# Patient Record
Sex: Female | Born: 1980 | Race: Black or African American | Hispanic: No | Marital: Single | Smoking: Never smoker
Health system: Southern US, Community
[De-identification: ages and names within clinical notes are randomized; demographics above are authoritative.]

## PROBLEM LIST (undated history)

## (undated) DIAGNOSIS — D649 Anemia, unspecified: Secondary | ICD-10-CM

## (undated) DIAGNOSIS — R011 Cardiac murmur, unspecified: Secondary | ICD-10-CM

---

## 2005-02-04 ENCOUNTER — Emergency Department: Payer: Self-pay | Admitting: Emergency Medicine

## 2005-06-12 ENCOUNTER — Emergency Department: Payer: Self-pay | Admitting: Emergency Medicine

## 2005-07-18 ENCOUNTER — Emergency Department: Payer: Self-pay | Admitting: Emergency Medicine

## 2005-07-18 ENCOUNTER — Other Ambulatory Visit: Payer: Self-pay

## 2005-07-22 ENCOUNTER — Observation Stay: Payer: Self-pay | Admitting: Obstetrics & Gynecology

## 2005-08-03 ENCOUNTER — Inpatient Hospital Stay: Payer: Self-pay

## 2005-08-07 ENCOUNTER — Emergency Department: Payer: Self-pay | Admitting: General Practice

## 2006-06-19 ENCOUNTER — Emergency Department: Payer: Self-pay | Admitting: Emergency Medicine

## 2006-09-23 ENCOUNTER — Emergency Department: Payer: Self-pay | Admitting: Emergency Medicine

## 2007-08-03 ENCOUNTER — Emergency Department: Payer: Self-pay | Admitting: Emergency Medicine

## 2008-02-19 ENCOUNTER — Emergency Department: Payer: Self-pay | Admitting: Emergency Medicine

## 2008-12-12 ENCOUNTER — Emergency Department: Payer: Self-pay | Admitting: Emergency Medicine

## 2011-05-04 ENCOUNTER — Emergency Department: Payer: Self-pay | Admitting: *Deleted

## 2011-05-19 ENCOUNTER — Emergency Department: Payer: Self-pay | Admitting: *Deleted

## 2012-03-15 ENCOUNTER — Emergency Department: Payer: Self-pay | Admitting: Emergency Medicine

## 2012-03-15 LAB — URINALYSIS, COMPLETE
Bilirubin,UR: NEGATIVE
Ketone: NEGATIVE
Nitrite: POSITIVE
Protein: 30
Renal Epithelial: 1
Specific Gravity: 1.024 (ref 1.003–1.030)
Squamous Epithelial: 16
Transitional Epi: <1
WBC UR: 106 /HPF (ref 0–5)
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
## 2013-07-02 ENCOUNTER — Emergency Department: Payer: Self-pay | Admitting: Emergency Medicine

## 2013-07-02 LAB — COMPREHENSIVE METABOLIC PANEL
Albumin: 4.1 g/dL (ref 3.4–5.0)
Alkaline Phosphatase: 72 U/L (ref 50–136)
Bilirubin,Total: 0.5 mg/dL (ref 0.2–1.0)
Calcium, Total: 9 mg/dL (ref 8.5–10.1)
Chloride: 107 mmol/L (ref 98–107)
Co2: 24 mmol/L (ref 21–32)
Creatinine: 0.73 mg/dL (ref 0.60–1.30)
EGFR (African American): >60
EGFR (Non-African Amer.): >60
Glucose: 102 mg/dL — ABNORMAL HIGH (ref 65–99)
Osmolality: 272 (ref 275–301)
SGOT(AST): 22 U/L (ref 15–37)
SGPT (ALT): 18 U/L (ref 12–78)
Total Protein: 8.4 g/dL — ABNORMAL HIGH (ref 6.4–8.2)

## 2013-07-02 LAB — URINALYSIS, COMPLETE
Bacteria: NONE SEEN
Bilirubin,UR: NEGATIVE
Blood: NEGATIVE
Glucose,UR: NEGATIVE mg/dL (ref 0–75)
Ketone: NEGATIVE
Leukocyte Esterase: NEGATIVE
Nitrite: NEGATIVE
Protein: NEGATIVE
RBC,UR: <1 /HPF (ref 0–5)
Specific Gravity: 1.018 (ref 1.003–1.030)
WBC UR: <1 /HPF (ref 0–5)

## 2013-07-02 LAB — CBC
HCT: 36.3 % (ref 35.0–47.0)
MCH: 28.3 pg (ref 26.0–34.0)
MCHC: 33.6 g/dL (ref 32.0–36.0)
MCV: 84 fL (ref 80–100)
RBC: 4.31 10*6/uL (ref 3.80–5.20)
RDW: 13.8 % (ref 11.5–14.5)

## 2013-09-04 ENCOUNTER — Emergency Department: Payer: Self-pay | Admitting: Emergency Medicine

## 2013-09-07 LAB — BETA STREP CULTURE(ARMC)

## 2015-03-19 ENCOUNTER — Encounter (HOSPITAL_COMMUNITY): Payer: Self-pay

## 2015-03-19 ENCOUNTER — Emergency Department (HOSPITAL_COMMUNITY)
Admission: EM | Admit: 2015-03-19 | Discharge: 2015-03-19 | Disposition: A | Discharge status: Home / Self Care | Payer: Self-pay | Attending: Emergency Medicine | Admitting: Emergency Medicine

## 2015-03-19 DIAGNOSIS — T490X5A Adverse effect of local antifungal, anti-infective and anti-inflammatory drugs, initial encounter: Secondary | ICD-10-CM | POA: Insufficient documentation

## 2015-03-19 DIAGNOSIS — Z872 Personal history of diseases of the skin and subcutaneous tissue: Secondary | ICD-10-CM | POA: Insufficient documentation

## 2015-03-19 DIAGNOSIS — T7840XA Allergy, unspecified, initial encounter: Secondary | ICD-10-CM | POA: Insufficient documentation

## 2015-03-19 MED ORDER — PREDNISONE 10 MG PO TABS: ORAL_TABLET | ORAL | Status: DC

## 2015-03-19 NOTE — ED Notes (Signed)
Pt reports started prescription Regin A gel 0.025% (tretinoin) 03-14-15 for first dose on face.  At bedtime she cleans face with proactiv sin purifying mask, applied Retin A, then applied proactiv eye brightening serum.  Onset morning of 03-15-15 woke up and bilateral eyes burning, itching, watering.  Has had those symptoms since and woke 03-18-15 to bilateral eye swellling and redness around eyes.  Blurred vision noted.

## 2015-03-19 NOTE — ED Provider Notes (Signed)
CSN: [id number]     Arrival date & time 03/19/15  1326 History  This chart was scribed for non-physician practitioner, Glendell Docker, NP, working with Daleen Bo, MD, by Stephania Fragmin, ED Scribe. This patient was seen in room TR04C/TR04C and the patient's care was started at 2:12 PM.      Chief Complaint  Patient presents with  . Facial Swelling   The history is provided by the patient. No language interpreter was used.    HPI Comments: Rebecca Dudley is a 34 y.o. female who presents to the Emergency Department complaining of gradual onset, constant, worsening bilateral eye burning, itching, and watering that began 4 days ago after she woke up; the day before, she had just applied her first dose of prescription tretinoin 0.025% gel on her face. Patient then developed bilateral eye swelling and redness yesterday. Patient also uses Proactiv purifying mask and Proactiv brightening serum along with the gel, but she states she started using it before the tretinoin with no adverse effects. She states she has had a similar allergic reaction to a topical treatment in the past, but it was not as severe at this. Patient has not tried any medication for this. She states she tried to avoid Benadryl for the sedating side effects. She denies a history of DM.  History reviewed. No pertinent past medical history. Past Surgical History  Procedure Laterality Date  . Cesarean section     History reviewed. No pertinent family history. History  Substance Use Topics  . Smoking status: Never Smoker   . Smokeless tobacco: Not on file  . Alcohol Use: No   OB History    No data available     Review of Systems  HENT: Positive for facial swelling.   Eyes: Positive for pain, discharge (watery), redness and itching.  All other systems reviewed and are negative.   Allergies  Review of patient's allergies indicates no known allergies.  Home Medications   Prior to Admission medications   Not on File    BP 130/85 mmHg  Pulse 71  Temp(Src) 98.4 F (36.9 C) (Oral)  Resp 18  Ht 4\' 11"  (1.499 m)  Wt 170 lb 14.4 oz (77.52 kg)  BMI 34.50 kg/m2  SpO2 98%  LMP 03/05/2015 Physical Exam  Constitutional: She is oriented to person, place, and time. She appears well-developed and well-nourished. No distress.  HENT:  Head: Normocephalic and atraumatic.  Right Ear: External ear normal.  Left Ear: External ear normal.  Mouth/Throat: Oropharynx is clear and moist.  No oral mucosal swelling  Eyes: Conjunctivae and EOM are normal. Pupils are equal, round, and reactive to light.  No swelling redness noted to conjunctiva  Neck: Neck supple. No tracheal deviation present.  Cardiovascular: Normal rate.   Pulmonary/Chest: Effort normal. No respiratory distress.  Musculoskeletal: Normal range of motion.  Neurological: She is alert and oriented to person, place, and time. She exhibits normal muscle tone. Coordination normal.  Skin:  erythematous dry patchy ares noted to eyes bilaterally. No drainage or warmth noted.  Psychiatric: She has a normal mood and affect. Her behavior is normal.  Nursing note and vitals reviewed.   ED Course  Procedures (including critical care time)  DIAGNOSTIC STUDIES: Oxygen Saturation is 98% on RA, normal by my interpretation.    COORDINATION OF CARE: 2:14 PM - Discussed treatment plan with pt at bedside which includes Rx steroids, Zyrtec and/or Benadryl, and pt agreed to plan.   MDM   Final diagnoses:  Allergic reaction, initial encounter   History and rash consistent with contact dermatitis.no oral swelling noted  I personally performed the services described in this documentation, which was scribed in my presence. The recorded information has been reviewed and is accurate.    Glendell Docker, NP 03/19/15 [place], MD 03/20/15 [phone]

## 2015-03-19 NOTE — Discharge Instructions (Signed)
Take zyrtec and benadryl as discussed. Stop the face creams Allergies  Allergies may happen from anything your body is sensitive to. This may be food, medicines, pollens, chemicals, and many other things. Food allergies can be severe and deadly.  HOME CARE  If you do not know what causes a reaction, keep a diary. Write down the foods you ate and the symptoms that followed. Avoid foods that cause reactions.  If you have red raised spots (hives) or a rash:  Take medicine as told by your doctor.  Use medicines for red raised spots and itching as needed.  Apply cold cloths (compresses) to the skin. Take a cool bath. Avoid hot baths or showers.  If you are severely allergic:  It is often necessary to go to the hospital after you have treated your reaction.  Wear your medical alert jewelry.  You and your family must learn how to give a allergy shot or use an allergy kit (anaphylaxis kit).  Always carry your allergy kit or shot with you. Use this medicine as told by your doctor if a severe reaction is occurring. GET HELP RIGHT AWAY IF:  You have trouble breathing or are making high-pitched whistling sounds (wheezing).  You have a tight feeling in your chest or throat.  You have a puffy (swollen) mouth.  You have red raised spots, puffiness (swelling), or itching all over your body.  You have had a severe reaction that was helped by your allergy kit or shot. The reaction can return once the medicine has worn off.  You think you are having a food allergy. Symptoms most often happen within 30 minutes of eating a food.  Your symptoms have not gone away within 2 days or are getting worse.  You have new symptoms.  You want to retest yourself with a food or drink you think causes an allergic reaction. Only do this under the care of a doctor. MAKE SURE YOU:   Understand these instructions.  Will watch your condition.  Will get help right away if you are not doing well or get  worse. Document Released: 01/16/2013 Document Reviewed: 01/16/2013 [facility] Patient Information 2015 [place]. This information is not intended to replace advice given to you by your health care provider. Make sure you discuss any questions you have with your health care provider.

## 2015-03-27 ENCOUNTER — Emergency Department (HOSPITAL_COMMUNITY)
Admission: EM | Admit: 2015-03-27 | Discharge: 2015-03-27 | Disposition: A | Discharge status: Home / Self Care | Payer: PRIVATE HEALTH INSURANCE | Source: Home / Self Care | Attending: Family Medicine | Admitting: Family Medicine

## 2015-03-27 ENCOUNTER — Encounter (HOSPITAL_COMMUNITY): Payer: Self-pay | Admitting: Emergency Medicine

## 2015-03-27 DIAGNOSIS — J302 Other seasonal allergic rhinitis: Secondary | ICD-10-CM

## 2015-03-27 MED ORDER — CETIRIZINE HCL 10 MG PO TABS: 10.0000 mg | ORAL_TABLET | Freq: Every day | ORAL | Status: AC

## 2015-03-27 MED ORDER — MOMETASONE FUROATE 0.1 % EX CREA: 1.0000 "application " | TOPICAL_CREAM | Freq: Every day | CUTANEOUS | Status: DC

## 2015-03-27 MED ORDER — METHYLPREDNISOLONE ACETATE 40 MG/ML IJ SUSP
80.0000 mg | Freq: Once | INTRAMUSCULAR | Status: AC
  Administered 2015-03-27: 80 mg via INTRAMUSCULAR

## 2015-03-27 MED ORDER — FLUTICASONE PROPIONATE 50 MCG/ACT NA SUSP: 1.0000 | Freq: Two times a day (BID) | NASAL | Status: DC

## 2015-03-27 MED ORDER — METHYLPREDNISOLONE ACETATE 80 MG/ML IJ SUSP
INTRAMUSCULAR | Status: AC
  Filled 2015-03-27: qty 1

## 2015-03-27 NOTE — ED Notes (Signed)
Reports itchy, watery, red eyes initially and seen in the ed, but no improvement.  Patient now has runny nose, cough, sob and chest congestion

## 2015-03-27 NOTE — ED Provider Notes (Signed)
CSN: [id number]     Arrival date & time 03/27/15  1818 History   First MD Initiated Contact with Patient 03/27/15 1845     Chief Complaint  Patient presents with  . URI   (Consider location/radiation/quality/duration/timing/severity/associated sxs/prior Treatment) Patient is a 34 y.o. female presenting with URI. The history is provided by the patient.  URI Presenting symptoms: congestion, cough and rhinorrhea   Presenting symptoms: no fever   Severity:  Mild Duration:  1 week Progression:  Worsening Chronicity:  New (new to this area for 37yr.) Ineffective treatments:  OTC medications Associated symptoms: sneezing   Associated symptoms: no wheezing     History reviewed. No pertinent past medical history. Past Surgical History  Procedure Laterality Date  . Cesarean section     No family history on file. History  Substance Use Topics  . Smoking status: Never Smoker   . Smokeless tobacco: Not on file  . Alcohol Use: No   OB History    No data available     Review of Systems  Constitutional: Negative.  Negative for fever.  HENT: Positive for congestion, postnasal drip, rhinorrhea and sneezing.   Eyes: Positive for redness and itching.  Respiratory: Positive for cough. Negative for shortness of breath and wheezing.     Allergies  Review of patient's allergies indicates no known allergies.  Home Medications   Prior to Admission medications   Medication Sig Start Date End Date Taking? Authorizing Provider  cetirizine (ZYRTEC) 10 MG tablet Take 1 tablet (10 mg total) by mouth daily. One tab daily for allergies 03/27/15   Billy Fischer, MD  fluticasone [facility]) 50 MCG/ACT nasal spray Place 1 spray into both nostrils 2 (two) times daily. 03/27/15   Billy Fischer, MD  mometasone (ELOCON) 0.1 % cream Apply 1 application topically daily. To lower eyelids for itching 03/27/15   Billy Fischer, MD  predniSONE (DELTASONE) 10 MG tablet 6 day stepdown dose 03/19/15   Glendell Docker,  NP   BP 130/89 mmHg  Pulse 74  Temp(Src) 99.4 F (37.4 C) (Oral)  SpO2 100%  LMP 03/05/2015 Physical Exam  Constitutional: She is oriented to person, place, and time. She appears well-developed and well-nourished. No distress.  HENT:  Right Ear: External ear normal.  Left Ear: External ear normal.  Mouth/Throat: Oropharynx is clear and moist.  Eyes: EOM and lids are normal. Pupils are equal, round, and reactive to light. Right conjunctiva is injected. Left conjunctiva is injected.  Neck: Normal range of motion. Neck supple.  Cardiovascular: Regular rhythm, normal heart sounds and intact distal pulses.   Pulmonary/Chest: Effort normal and breath sounds normal. She has no wheezes.  Neurological: She is alert and oriented to person, place, and time.  Skin: Skin is warm and dry.  Nursing note and vitals reviewed.   ED Course  Procedures (including critical care time) Labs Review Labs Reviewed - No data to display  Imaging Review No results found.   MDM   1. Seasonal allergic rhinitis        Billy Fischer, MD 03/27/15 [phone]

## 2015-05-17 ENCOUNTER — Emergency Department (HOSPITAL_COMMUNITY)
Admission: EM | Admit: 2015-05-17 | Discharge: 2015-05-17 | Disposition: A | Discharge status: Home / Self Care | Payer: No Typology Code available for payment source | Attending: Emergency Medicine | Admitting: Emergency Medicine

## 2015-05-17 ENCOUNTER — Encounter (HOSPITAL_COMMUNITY): Payer: Self-pay

## 2015-05-17 DIAGNOSIS — Y9389 Activity, other specified: Secondary | ICD-10-CM | POA: Diagnosis not present

## 2015-05-17 DIAGNOSIS — S60862A Insect bite (nonvenomous) of left wrist, initial encounter: Secondary | ICD-10-CM | POA: Diagnosis not present

## 2015-05-17 DIAGNOSIS — Y998 Other external cause status: Secondary | ICD-10-CM | POA: Diagnosis not present

## 2015-05-17 DIAGNOSIS — Y9289 Other specified places as the place of occurrence of the external cause: Secondary | ICD-10-CM | POA: Diagnosis not present

## 2015-05-17 DIAGNOSIS — W57XXXA Bitten or stung by nonvenomous insect and other nonvenomous arthropods, initial encounter: Secondary | ICD-10-CM | POA: Insufficient documentation

## 2015-05-17 DIAGNOSIS — R2232 Localized swelling, mass and lump, left upper limb: Secondary | ICD-10-CM | POA: Diagnosis not present

## 2015-05-17 DIAGNOSIS — L03114 Cellulitis of left upper limb: Secondary | ICD-10-CM | POA: Diagnosis not present

## 2015-05-17 DIAGNOSIS — S50862A Insect bite (nonvenomous) of left forearm, initial encounter: Secondary | ICD-10-CM | POA: Insufficient documentation

## 2015-05-17 DIAGNOSIS — I891 Lymphangitis: Secondary | ICD-10-CM

## 2015-05-17 LAB — BASIC METABOLIC PANEL
ANION GAP: 11 (ref 5–15)
BUN: 15 mg/dL (ref 6–20)
CALCIUM: 9.5 mg/dL (ref 8.9–10.3)
CO2: 22 mmol/L (ref 22–32)
Chloride: 105 mmol/L (ref 101–111)
Creatinine, Ser: 0.84 mg/dL (ref 0.44–1.00)
GFR calc Af Amer: >60 mL/min (ref >60)
GLUCOSE: 87 mg/dL (ref 65–99)
Potassium: 4.3 mmol/L (ref 3.5–5.1)
Sodium: 138 mmol/L (ref 135–145)

## 2015-05-17 LAB — CBC WITH DIFFERENTIAL/PLATELET
Basophils Absolute: 0 10*3/uL (ref 0.0–0.1)
Basophils Relative: 0 % (ref 0–1)
Eosinophils Absolute: 0.1 10*3/uL (ref 0.0–0.7)
Eosinophils Relative: 2 % (ref 0–5)
HEMATOCRIT: 40 % (ref 36.0–46.0)
Hemoglobin: 12.8 g/dL (ref 12.0–15.0)
LYMPHS ABS: 2 10*3/uL (ref 0.7–4.0)
Lymphocytes Relative: 28 % (ref 12–46)
MCH: 28.1 pg (ref 26.0–34.0)
MCHC: 32 g/dL (ref 30.0–36.0)
MCV: 87.7 fL (ref 78.0–100.0)
MONO ABS: 0.4 10*3/uL (ref 0.1–1.0)
Monocytes Relative: 6 % (ref 3–12)
NEUTROS ABS: 4.6 10*3/uL (ref 1.7–7.7)
Neutrophils Relative %: 64 % (ref 43–77)
Platelets: 295 10*3/uL (ref 150–400)
RBC: 4.56 MIL/uL (ref 3.87–5.11)
RDW: 14.7 % (ref 11.5–15.5)
WBC: 7.2 10*3/uL (ref 4.0–10.5)

## 2015-05-17 MED ORDER — CLINDAMYCIN PHOSPHATE 600 MG/50ML IV SOLN
600.0000 mg | Freq: Once | INTRAVENOUS | Status: AC
  Administered 2015-05-17: 600 mg via INTRAVENOUS
  Filled 2015-05-17: qty 50

## 2015-05-17 MED ORDER — DIPHENHYDRAMINE HCL 50 MG/ML IJ SOLN
25.0000 mg | Freq: Once | INTRAMUSCULAR | Status: AC
  Administered 2015-05-17: 25 mg via INTRAVENOUS
  Filled 2015-05-17: qty 1

## 2015-05-17 MED ORDER — CLINDAMYCIN HCL 300 MG PO CAPS: 300.0000 mg | ORAL_CAPSULE | Freq: Four times a day (QID) | ORAL | Status: DC

## 2015-05-17 NOTE — Discharge Instructions (Signed)
-   Take clindamycin 300 mg four times a day for 10 days - OTC benadryl for itching - Return to ED with worsening pain at wrist, progression of redness and swelling, fever, muscle aches, vision changes, chest pain, shortness of breath, nausea, vomiting, diarrhea, muscle weakness or further worsening of symptoms.

## 2015-05-17 NOTE — ED Notes (Signed)
Pt bit by spider in car yesterday - c/o itching last night. This morning noticed swelling and redness spreading up arm, has spread more since this am. Some drainage from site, redness and swelling noted at site, redness spread up arm. C/o numbness in left hand.

## 2015-05-17 NOTE — ED Provider Notes (Signed)
CSN: [id number]     Arrival date & time 05/17/15  23 History   First MD Initiated Contact with Patient 05/17/15 1037     Chief Complaint  Patient presents with  . Insect Bite    HPI  Ms. Pellecchia is a 34 year old female presenting today with a bug bite. She reports a bug bite yesterday morning on her way to work. She did not see what type of bug bit her. No pain at the site until later in the evening when it started to itch and swell. She says the left wrist is now red and warm with a red line tracking up the forearm. This morning she noticed clear yellow drainage from the site. She now has numbness and "pins and needles" in the digits of the left hand. She has tried neosporin, mometasone and A&D diaper rash cream on the bite. No fever, headache, vision changes, stiff neck, chest pain, shortness of breath, abdominal pain, nausea, vomiting, diarrhea, muscle aches or weakness.   History reviewed. No pertinent past medical history. Past Surgical History  Procedure Laterality Date  . Cesarean section     History reviewed. No pertinent family history. Social History  Substance Use Topics  . Smoking status: Never Smoker   . Smokeless tobacco: None  . Alcohol Use: No   OB History    No data available     Review of Systems  Constitutional: Negative for fever, chills and diaphoresis.  Eyes: Negative for visual disturbance.  Respiratory: Negative for shortness of breath.   Cardiovascular: Negative for chest pain.  Gastrointestinal: Negative for nausea, vomiting, abdominal pain and diarrhea.  Musculoskeletal: Positive for joint swelling. Negative for myalgias and arthralgias.  Skin: Positive for color change.  Neurological: Positive for numbness. Negative for dizziness, weakness and headaches.      Allergies  Review of patient's allergies indicates no known allergies.  Home Medications   Prior to Admission medications   Medication Sig Start Date End Date Taking? Authorizing  Provider  cetirizine (ZYRTEC) 10 MG tablet Take 1 tablet (10 mg total) by mouth daily. One tab daily for allergies Patient not taking: Reported on 05/17/2015 03/27/15   Billy Fischer, MD  clindamycin (CLEOCIN) 300 MG capsule Take 1 capsule (300 mg total) by mouth 4 (four) times daily. 05/17/15   Verania Salberg, PA-C  fluticasone (FLONASE) 50 MCG/ACT nasal spray Place 1 spray into both nostrils 2 (two) times daily. Patient not taking: Reported on 05/17/2015 03/27/15   Billy Fischer, MD  mometasone (ELOCON) 0.1 % cream Apply 1 application topically daily. To lower eyelids for itching Patient not taking: Reported on 05/17/2015 03/27/15   Billy Fischer, MD  predniSONE (DELTASONE) 10 MG tablet 6 day stepdown dose Patient not taking: Reported on 05/17/2015 03/19/15   Glendell Docker, NP   BP 109/62 mmHg  Pulse 61  Temp(Src) 98.6 F (37 C) (Oral)  Resp 16  SpO2 100% Physical Exam  Constitutional: She is oriented to person, place, and time. She appears well-developed and well-nourished. No distress.  Cardiovascular: Normal rate and regular rhythm.   Pulmonary/Chest: Breath sounds normal. No respiratory distress.  Abdominal: Soft. She exhibits no distension. There is no tenderness.  Musculoskeletal:  Full ROM of left elbow, wrist and digits. Radial pulses palpable b/l. Edema of left wrist and distal forearm.   Neurological: She is alert and oriented to person, place, and time.  Sensation intact over left wrist and fingers. 5/5 strength of left wrist, elbow and  grip.   Skin: Skin is warm and dry.     Erythema and edema over left wrist and tracking up left forearm. Insect bite over ventral aspect of left wrist with dry, flaky skin around bite. Small amount of serosanguinous fluid draining from bite. No abscess present   11:45 - Skin recheck, no progression since first exam, improving edema 12:50 - Skin recheck, no progression since first exam  ED Course  Procedures (including critical care time) Labs  Review Labs Reviewed  CBC WITH DIFFERENTIAL/PLATELET  BASIC METABOLIC PANEL    Imaging Review No results found. Acie Fredrickson Terri Rorrer, personally reviewed and evaluated these images and lab results as part of my medical decision-making.   EKG Interpretation None      MDM   Final diagnoses:  Cellulitis of left upper extremity  Lymphangitis    1. Cellulitis with lymphangitis  - Benadryl 25 mg and clindamycin 600 mg IV given in ED - CBC and BMP unremarkable - Outlined erythema, will continue to monitor progression - Erythema shows no progression during ED stay, edema improving - Prescribed 300 mg Clindamycin QID for 10 days - Follow up with PCP in 2-3 days for skin check - OTC benadryl for itching - Return to ED if rapid swelling of wrist, spreading of erythema, fever, vision changes, chest pain, shortness of breath, muscle weakness, pus drainage from the bite, abscess formation or further worsening of symptoms occurs     Josephina Gip, PA-C 05/17/15 [place] Liu, MD 05/17/15 2114

## 2015-05-17 NOTE — ED Notes (Signed)
PA at bedside - wound marked.

## 2015-05-17 NOTE — ED Notes (Signed)
Examined redness/swelling on left arm - improved and not spreading outside marked area.

## 2021-05-12 ENCOUNTER — Emergency Department
Admission: EM | Admit: 2021-05-12 | Discharge: 2021-05-12 | Disposition: A | Discharge status: Home / Self Care | Payer: BC Managed Care – PPO | Attending: Emergency Medicine | Admitting: Emergency Medicine

## 2021-05-12 ENCOUNTER — Other Ambulatory Visit: Payer: Self-pay

## 2021-05-12 DIAGNOSIS — R102 Pelvic and perineal pain: Secondary | ICD-10-CM

## 2021-05-12 DIAGNOSIS — R58 Hemorrhage, not elsewhere classified: Secondary | ICD-10-CM | POA: Diagnosis not present

## 2021-05-12 LAB — URINALYSIS, COMPLETE (UACMP) WITH MICROSCOPIC
Bilirubin Urine: NEGATIVE
Glucose, UA: NEGATIVE mg/dL
Ketones, ur: NEGATIVE mg/dL
Leukocytes,Ua: NEGATIVE
Nitrite: NEGATIVE
Protein, ur: 100 mg/dL — AB
RBC / HPF: >50 RBC/hpf — ABNORMAL HIGH (ref 0–5)
Specific Gravity, Urine: 1.016 (ref 1.005–1.030)
pH: 5 (ref 5.0–8.0)

## 2021-05-12 LAB — POC URINE PREG, ED: Preg Test, Ur: NEGATIVE

## 2021-05-12 MED ORDER — KETOROLAC TROMETHAMINE 60 MG/2ML IM SOLN
60.0000 mg | Freq: Once | INTRAMUSCULAR | Status: AC
  Administered 2021-05-12: 60 mg via INTRAMUSCULAR
  Filled 2021-05-12: qty 2

## 2021-05-12 NOTE — Discharge Instructions (Addendum)
Follow-up with your regular doctor.  Please call for an appointment.  Take over-the-counter Aleve or ibuprofen for pain.  Return emergency department worsening

## 2021-05-12 NOTE — ED Provider Notes (Signed)
[facility] Emergency Department Provider Note  ____________________________________________   Event Date/Time   First MD Initiated Contact with Patient 05/12/21 1019     (approximate)  I have reviewed the triage vital signs and the nursing notes.   HISTORY  Chief Complaint Pelvic Pain    HPI Rebecca Dudley is a 40 y.o. female presents emergency department complaining of pelvic pain and cramping from her fibroids.  Patient states that she has been seeing Dr. Glory Buff at [facility].  States they gave her medication to stop her periods but this did not help.  States she continues to have bleeding.  States very bloated from fibroids.  States she called the office and they told her to come here for pain control.  She did not take any over-the-counter medications prior to coming to the emergency department  No past medical history on file.  There are no problems to display for this patient.   Past Surgical History:  Procedure Laterality Date   CESAREAN SECTION      Prior to Admission medications   Medication Sig Start Date End Date Taking? Authorizing Provider  hydrochlorothiazide (HYDRODIURIL) 25 MG tablet Take 25 mg by mouth daily.   Yes [provider]  norgestimate-ethinyl estradiol (ORTHO-CYCLEN) 0.25-35 MG-MCG tablet Take 1 tablet by mouth daily.   Yes [provider]  cetirizine (ZYRTEC) 10 MG tablet Take 1 tablet (10 mg total) by mouth daily. One tab daily for allergies Patient not taking: Reported on 05/17/2015 03/27/15   Billy Fischer, MD  fluticasone [facility]) 50 MCG/ACT nasal spray Place 1 spray into both nostrils 2 (two) times daily. Patient not taking: Reported on 05/17/2015 03/27/15   Billy Fischer, MD  mometasone (ELOCON) 0.1 % cream Apply 1 application topically daily. To lower eyelids for itching Patient not taking: Reported on 05/17/2015 03/27/15   Billy Fischer, MD    Allergies Patient has no known  allergies.  No family history on file.  Social History Social History   Tobacco Use   Smoking status: Never  Substance Use Topics   Alcohol use: No   Drug use: No    Review of Systems  Constitutional: No fever/chills Eyes: No visual changes. ENT: No sore throat. Respiratory: Denies cough Cardiovascular: Denies chest pain Gastrointestinal: Denies abdominal pain Genitourinary: Negative for dysuria. Musculoskeletal: Negative for back pain. Skin: Negative for rash. Psychiatric: no mood changes,     ____________________________________________   PHYSICAL EXAM:  VITAL SIGNS: ED Triage Vitals [05/12/21 1003]  Enc Vitals Group     BP 130/80     Pulse Rate 78     Resp 16     Temp 97.6 F (36.4 C)     Temp Source Oral     SpO2 99 %     Weight 190 lb (86.2 kg)     Height 5' (1.524 m)     Head Circumference      Peak Flow      Pain Score 10     Pain Loc      Pain Edu?      Excl. in [place]?     Constitutional: Alert and oriented. Well appearing and in no acute distress. Eyes: Conjunctivae are normal.  Head: Atraumatic. Nose: No congestion/rhinnorhea. Mouth/Throat: Mucous membranes are moist.   Neck:  supple no lymphadenopathy noted Cardiovascular: Normal rate, regular rhythm. Heart sounds are normal Respiratory: Normal respiratory effort.  No retractions, lungs c t a  Abd: soft tender in  the pelvic area, bs normal all 4 quad GU: deferred Musculoskeletal: FROM all extremities, warm and well perfused Neurologic:  Normal speech and language.  Skin:  Skin is warm, dry and intact. No rash noted. Psychiatric: Mood and affect are normal. Speech and behavior are normal.  ____________________________________________   LABS (all labs ordered are listed, but only abnormal results are displayed)  Labs Reviewed  URINALYSIS, COMPLETE (UACMP) WITH MICROSCOPIC - Abnormal; Notable for the following components:      Result Value   Color, Urine ORANGE (*)    APPearance HAZY  (*)    Hgb urine dipstick LARGE (*)    Protein, ur 100 (*)    RBC / HPF >50 (*)    Bacteria, UA FEW (*)    All other components within normal limits  POC URINE PREG, ED   ____________________________________________   ____________________________________________  RADIOLOGY    ____________________________________________   PROCEDURES  Procedure(s) performed: No  Procedures    ____________________________________________   INITIAL IMPRESSION / ASSESSMENT AND PLAN / ED COURSE  Pertinent labs & imaging results that were available during my care of the patient were reviewed by me and considered in my medical decision making (see chart for details).   The patient is a 40 year old female presents with cramping and pelvic pain secondary to fibroids and bleeding.  See HPI.  Physical exam shows patient per stable.  Patient was recently evaluated by her physician and an ultrasound was done to prove fibroid tumors.  Patient states she is to follow-up with her doctor to discuss a treatment plan.  However today she did call the office about pain and they told her to come emergency department.  She did not take any over-the-counter medications so we will start with Toradol for pain.  I explained to her that there is not a treatment that we can do for her fibroids here in the emergency department.  However we can treat her pain.  She was agreeable to have pain control and follow-up with her doctor.   ----------------------------------------- 11:25 AM on 05/12/2021 ----------------------------------------- Patient's urinalysis does not show any infection  Patient states that she feels better after the injection.  She is to follow-up with her regular doctor.  Return if worsening.  She is discharged stable condition with a work note.  Rebecca Dudley was evaluated in Emergency Department on 05/12/2021 for the symptoms described in the history of present illness. She was evaluated in  the context of the global COVID-19 pandemic, which necessitated consideration that the patient might be at risk for infection with the SARS-CoV-2 virus that causes COVID-19. Institutional protocols and algorithms that pertain to the evaluation of patients at risk for COVID-19 are in a state of rapid change based on information released by regulatory bodies including the CDC and federal and state organizations. These policies and algorithms were followed during the patient's care in the ED.    As part of my medical decision making, I reviewed the following data within the [facility] notes reviewed and incorporated, Labs reviewed , Old chart reviewed, Notes from prior ED visits, and [place] Controlled Substance Database  ____________________________________________   FINAL CLINICAL IMPRESSION(S) / ED DIAGNOSES  Final diagnoses:  Pelvic pain in female      NEW MEDICATIONS STARTED DURING THIS VISIT:  New Prescriptions   No medications on file     Note:  This document was prepared using Dragon voice recognition software and may include unintentional dictation errors.  Versie Starks, PA-C 05/12/21 1126    Lucrezia Starch, MD 05/12/21 1247

## 2021-05-12 NOTE — ED Triage Notes (Signed)
Pt here with pelvic pain since Sun. Pt has hx of fibroids but states that the pain has gotten much worse Sunday evening. Pt was supposed to see GYN in a few weeks but the pain has been too intense to wait. Pt describes pain as cramping and severe.

## 2021-05-12 NOTE — ED Notes (Signed)
See triage note  Presents with some vaginal bleeding and cramping  Hx of fibroids  States her PCP called in some meds to help with the pain  States not working  describes pain as "severe cramping"

## 2021-05-29 ENCOUNTER — Other Ambulatory Visit: Payer: Self-pay | Admitting: Obstetrics and Gynecology

## 2021-05-29 NOTE — H&P (Signed)
Rebecca Dudley is a 40 y.o. female here for [facility] and bilateral salpingectomy .   Pt has  heavy menorrhagia for the last 1 yr . Bleeds daily . She has been tried on Depo Provera and high dose OCPs . Without success .  G2P2 s/p c/s x 2 Pap 2020 U/s 7/ 2022: Abd and transvag US performed   Fibroid ut  1 mid =89 mm 2 rt ant=31 mm 3 rt post= 42 mm     Endometrium  Not seen   Neither ov seen      Past Medical History:  has a past medical history of Allergy, Anemia, Back pain, Elevated liver enzymes, Headache, Hypertension, and Menorrhagia.  Past Surgical History:  has a past surgical history that includes Cesarean section (2002) and Cesarean section (2006). Family History: family history includes Diabetes type II in her mother; High blood pressure (Hypertension) in her father and mother; No Known Problems in her half-sister and half-sister; Prostate cancer in her father. Social History:  reports that she quit smoking about 8 years ago. Her smoking use included cigarettes. She has never used smokeless tobacco. She reports current alcohol use. She reports previous drug use. OB/GYN History:          OB History     Gravida  2   Para  2   Term  2   Preterm      AB      Living  2      SAB      IAB      Ectopic      Molar      Multiple      Live Births  2             Allergies: has No Known Allergies. Medications:   Current Outpatient Medications:    ferrous sulfate 325 (65 FE) MG tablet, Take 1 tablet (325 mg total) by mouth daily with breakfast, Disp: 90 tablet, Rfl: 3   hydroCHLOROthiazide (MICROZIDE) 12.5 mg capsule, Take 1 capsule (12.5 mg total) by mouth once daily, Disp: 90 capsule, Rfl: 3   ESTARYLLA 0.25-35 mg-mcg tablet, TAKE 3 TABLETS BY MOUTH ONCE DAILY FOR 3 DAYS THEN TAKE 2 TABLETS ONCE DAILY FOR 2 DAYS THEN 1 TABLET DAILY FOR 23 DAYS (Patient not taking: Reported on 05/29/2021), Disp: 28 tablet, Rfl: 0   gabapentin (NEURONTIN) 300 MG capsule, Take 1  capsule (300 mg total) by mouth at bedtime for 10 days, Disp: 10 capsule, Rfl: 11   ibuprofen (MOTRIN) 800 MG tablet, Take 1 tablet (800 mg total) by mouth every 8 (eight) hours as needed for Pain, Disp: 30 tablet, Rfl: 1   medroxyPROGESTERone (DEPO-PROVERA) 150 mg/mL injection, , Disp: , Rfl:    norgestimate-ethinyl estradioL (SPRINTEC 0.25/35, 28,) 0.25-35 mg-mcg tablet, Take 3 tablets by mouth once daily for 3 days, THEN 2 tablets once daily for 2 days, THEN 1 tablet once daily for 15 days. Take 3 tablets by mouth for 3 days. (Patient not taking: Reported on 05/29/2021), Disp: 28 tablet, Rfl: 0   ondansetron (ZOFRAN) 8 MG tablet, Take 1 tablet (8 mg total) by mouth every 8 (eight) hours as needed for Nausea, Disp: 30 tablet, Rfl: 0   oxyCODONE-acetaminophen (PERCOCET) 5-325 mg tablet, Take 1 tablet by mouth every 4 (four) hours as needed for Pain, Disp: 20 tablet, Rfl: 0   Review of Systems: General:  No fatigue or weight loss Eyes:                           No vision changes Ears:                            No hearing difficulty Respiratory:                No cough or shortness of breath Pulmonary:                  No asthma or shortness of breath Cardiovascular:           No chest pain, palpitations, dyspnea on exertion Gastrointestinal:          No abdominal bloating, chronic diarrhea, constipations, masses, pain or hematochezia Genitourinary:             No hematuria, dysuria, abnormal vaginal discharge, pelvic pain, Menometrorrhagia Lymphatic:                   No swollen lymph nodes Musculoskeletal:         No muscle weakness Neurologic:                  No extremity weakness, syncope, seizure disorder Psychiatric:                  No history of depression, delusions or suicidal/homicidal ideation      Exam:       Vitals:    05/29/21 1031  BP: (!) 140/87  Pulse: 67      Body mass index is 37.5 kg/m.   WDWN white/ black female in NAD   Lungs: CTA  CV :  RRR without murmur   Breast: exam done in sitting and lying position : No dimpling or retraction, no dominant mass, no spontaneous discharge, no axillary adenopathy Neck:  no thyromegaly Abdomen: soft , no mass, normal active bowel sounds,  non-tender, no rebound tenderness Pelvic: tanner stage 5 ,  External genitalia: vulva /labia no lesions Urethra: no prolapse Vagina: grey d/c : + clue cells Cervix:high in vault  no lesions, no cervical motion tenderness   Uterus16 weeks irregular shape  non-tender Adnexa: no mass,  non-tender   Rectovaginal:  Endometrial biopsy: The cervix was cleaned with betadine and a single tooth tenaculum is applied to the anterior cervix. The Pipelle catheter was placed into the endometrial cavity. It sounds to 7 cm and adequate tissue was removed.    Impression:    The primary encounter diagnosis was Menorrhagia with irregular cycle. Diagnoses of Intramural leiomyoma of uterus, Screening for cervical cancer, Leukorrhea, and BV (bacterial vaginosis) were also pertinent to this visit.       Plan:   Spoke to her about the role of surgery given prior concervative measures have failed .  Recommend LSH . Details and diagrams showed . Risks of upstaging an occult cancer 1;770-1:10,000 discussed  She wishes to proceed with surgery  She is aware of the possibility that exlap and TAH  May have to be performed  Risks of the procedure discussed with her . All questions answered            Orders Placed This Encounter

## 2021-06-04 ENCOUNTER — Other Ambulatory Visit: Payer: Self-pay

## 2021-06-04 ENCOUNTER — Other Ambulatory Visit
Admission: RE | Admit: 2021-06-04 | Discharge: 2021-06-04 | Disposition: A | Discharge status: Home / Self Care | Payer: PRIVATE HEALTH INSURANCE | Source: Ambulatory Visit | Attending: Obstetrics and Gynecology | Admitting: Obstetrics and Gynecology

## 2021-06-04 HISTORY — DX: Cardiac murmur, unspecified: R01.1

## 2021-06-04 HISTORY — DX: Anemia, unspecified: D64.9

## 2021-06-04 NOTE — Patient Instructions (Signed)
Your procedure is scheduled on: Thursday June 12, 2021. Report to Day Surgery inside [facility] 2nd floor stop by admissions desk first before getting on elevator.  To find out your arrival time please call [phone] between 1PM - 3PM on Wednesday June 11, 2021.  Remember: Instructions that are not followed completely may result in serious medical risk,  up to and including death, or upon the discretion of your surgeon and anesthesiologist your  surgery may need to be rescheduled.     _X__ 1. Do not eat food after midnight the night before your procedure.                 No chewing gum or hard candies. You may drink clear liquids up to 2 hours                 before you are scheduled to arrive for your surgery- DO not drink clear                 liquids within 2 hours of the start of your surgery.                 Clear Liquids include:  water, apple juice without pulp, clear Gatorade, G2 or                  Gatorade Zero (avoid Red/Purple/Blue), Black Coffee or Tea (Do not add                 anything to coffee or tea).  __X__2.   Complete the "Ensure Clear Pre-surgery Clear Carbohydrate Drink" provided to you, 2 hours before arrival. **If you are diabetic you will be provided with an alternative drink, Gatorade Zero or G2.  __X__3.  On the morning of surgery brush your teeth with toothpaste and water, you                may rinse your mouth with mouthwash if you wish.  Do not swallow any toothpaste of mouthwash.     _X__ 4.  No Alcohol for 24 hours before or after surgery.   _X__ 5.  Do Not Smoke or use e-cigarettes For 24 Hours Prior to Your Surgery.                 Do not use any chewable tobacco products for at least 6 hours prior to                 Surgery.  _X__  6.  Do not use any recreational drugs (marijuana, cocaine, heroin, ecstasy, MDMA or other)                For at least one week prior to your surgery.  Combination of these drugs with  anesthesia                May have life threatening results.  __X__7.  Notify your doctor if there is any change in your medical condition      (cold, fever, infections).     Do not wear jewelry, make-up, hairpins, clips or nail polish. Do not wear lotions, powders, or perfumes. You may wear deodorant. Do not shave 48 hours prior to surgery. Men may shave face and neck. Do not bring valuables to the hospital.    [facility] is not responsible for any belongings or valuables.  Contacts, dentures or bridgework may not be worn into surgery. Leave your suitcase in the car. After surgery  it may be brought to your room. For patients admitted to the hospital, discharge time is determined by your treatment team.   Patients discharged the day of surgery will not be allowed to drive home.   Make arrangements for someone to be with you for the first 24 hours of your Same Day Discharge.   __X__ Take these medicines the morning of surgery with A SIP OF WATER:    1. None   2.   3.   4.  5.  6.  ____ Fleet Enema (as directed)   __X__ Use CHG Soap (or wipes) as directed  ____ Use Benzoyl Peroxide Gel as instructed  ____ Use inhalers on the day of surgery  ____ Stop metformin 2 days prior to surgery    ____ Take 1/2 of usual insulin dose the night before surgery. No insulin the morning          of surgery.   ____ Call your PCP, cardiologist, or Pulmonologist if taking Coumadin/Plavix/aspirin and ask when to stop before your surgery.   __X__ One Week prior to surgery- Stop Anti-inflammatories such as Ibuprofen, Aleve, Advil, Motrin, meloxicam (MOBIC), diclofenac, etodolac, ketorolac, Toradol, Daypro, piroxicam, Goody's or BC powders. OK TO USE TYLENOL IF NEEDED   __X__ Stop supplements until after surgery.    ____ Bring C-Pap to the hospital.    If you have any questions regarding your pre-procedure instructions,  Please call Pre-admit Testing at [phone]

## 2021-06-05 ENCOUNTER — Other Ambulatory Visit
Admission: RE | Admit: 2021-06-05 | Discharge: 2021-06-05 | Disposition: A | Discharge status: Home / Self Care | Payer: BC Managed Care – PPO | Source: Ambulatory Visit | Attending: Obstetrics and Gynecology | Admitting: Obstetrics and Gynecology

## 2021-06-05 DIAGNOSIS — Z01812 Encounter for preprocedural laboratory examination: Secondary | ICD-10-CM | POA: Insufficient documentation

## 2021-06-05 LAB — CBC
HCT: 34 % — ABNORMAL LOW (ref 36.0–46.0)
Hemoglobin: 11 g/dL — ABNORMAL LOW (ref 12.0–15.0)
MCH: 28.3 pg (ref 26.0–34.0)
MCHC: 32.4 g/dL (ref 30.0–36.0)
MCV: 87.4 fL (ref 80.0–100.0)
Platelets: 294 10*3/uL (ref 150–400)
RBC: 3.89 MIL/uL (ref 3.87–5.11)
RDW: 14.6 % (ref 11.5–15.5)
WBC: 5.6 10*3/uL (ref 4.0–10.5)
nRBC: 0 % (ref 0.0–0.2)

## 2021-06-05 LAB — BASIC METABOLIC PANEL
Anion gap: 6 (ref 5–15)
BUN: 14 mg/dL (ref 6–20)
CO2: 25 mmol/L (ref 22–32)
Calcium: 8.9 mg/dL (ref 8.9–10.3)
Chloride: 106 mmol/L (ref 98–111)
Creatinine, Ser: 0.78 mg/dL (ref 0.44–1.00)
GFR, Estimated: >60 mL/min (ref >60)
Glucose, Bld: 118 mg/dL — ABNORMAL HIGH (ref 70–99)
Potassium: 3.9 mmol/L (ref 3.5–5.1)
Sodium: 137 mmol/L (ref 135–145)

## 2021-06-05 LAB — TYPE AND SCREEN
ABO/RH(D): AB POS
Antibody Screen: NEGATIVE

## 2021-06-12 ENCOUNTER — Ambulatory Visit
Admission: RE | Admit: 2021-06-12 | Discharge: 2021-06-12 | Disposition: A | Discharge status: Home / Self Care | Payer: BC Managed Care – PPO | Attending: Obstetrics and Gynecology | Admitting: Obstetrics and Gynecology

## 2021-06-12 ENCOUNTER — Ambulatory Visit: Payer: BC Managed Care – PPO | Admitting: Urgent Care

## 2021-06-12 ENCOUNTER — Other Ambulatory Visit: Payer: Self-pay

## 2021-06-12 ENCOUNTER — Ambulatory Visit: Payer: BC Managed Care – PPO | Admitting: Certified Registered"

## 2021-06-12 ENCOUNTER — Encounter
Admission: RE | Disposition: A | Discharge status: Home / Self Care | Payer: Self-pay | Source: Home / Self Care | Attending: Obstetrics and Gynecology

## 2021-06-12 ENCOUNTER — Encounter: Payer: Self-pay | Admitting: Obstetrics and Gynecology

## 2021-06-12 DIAGNOSIS — I10 Essential (primary) hypertension: Secondary | ICD-10-CM | POA: Diagnosis not present

## 2021-06-12 DIAGNOSIS — D251 Intramural leiomyoma of uterus: Secondary | ICD-10-CM | POA: Diagnosis not present

## 2021-06-12 DIAGNOSIS — N920 Excessive and frequent menstruation with regular cycle: Secondary | ICD-10-CM | POA: Diagnosis not present

## 2021-06-12 DIAGNOSIS — N898 Other specified noninflammatory disorders of vagina: Secondary | ICD-10-CM | POA: Diagnosis not present

## 2021-06-12 DIAGNOSIS — Z8249 Family history of ischemic heart disease and other diseases of the circulatory system: Secondary | ICD-10-CM | POA: Insufficient documentation

## 2021-06-12 DIAGNOSIS — N838 Other noninflammatory disorders of ovary, fallopian tube and broad ligament: Secondary | ICD-10-CM | POA: Diagnosis not present

## 2021-06-12 DIAGNOSIS — Z793 Long term (current) use of hormonal contraceptives: Secondary | ICD-10-CM | POA: Diagnosis not present

## 2021-06-12 DIAGNOSIS — Z8042 Family history of malignant neoplasm of prostate: Secondary | ICD-10-CM | POA: Diagnosis not present

## 2021-06-12 DIAGNOSIS — Z791 Long term (current) use of non-steroidal anti-inflammatories (NSAID): Secondary | ICD-10-CM | POA: Insufficient documentation

## 2021-06-12 DIAGNOSIS — Z79899 Other long term (current) drug therapy: Secondary | ICD-10-CM | POA: Insufficient documentation

## 2021-06-12 DIAGNOSIS — Z87891 Personal history of nicotine dependence: Secondary | ICD-10-CM | POA: Diagnosis not present

## 2021-06-12 DIAGNOSIS — Z833 Family history of diabetes mellitus: Secondary | ICD-10-CM | POA: Diagnosis not present

## 2021-06-12 HISTORY — PX: LAPAROSCOPIC BILATERAL SALPINGECTOMY: SHX5889

## 2021-06-12 HISTORY — PX: LAPAROSCOPIC SUPRACERVICAL HYSTERECTOMY: SHX5399

## 2021-06-12 LAB — POCT PREGNANCY, URINE: Preg Test, Ur: NEGATIVE

## 2021-06-12 LAB — ABO/RH: ABO/RH(D): AB POS

## 2021-06-12 SURGERY — HYSTERECTOMY, SUPRACERVICAL, LAPAROSCOPIC
Anesthesia: General

## 2021-06-12 MED ORDER — BUPIVACAINE HCL (PF) 0.5 % IJ SOLN
INTRAMUSCULAR | Status: AC
  Filled 2021-06-12: qty 30

## 2021-06-12 MED ORDER — OXYCODONE-ACETAMINOPHEN 5-325 MG PO TABS
ORAL_TABLET | ORAL | Status: AC
  Filled 2021-06-12: qty 1

## 2021-06-12 MED ORDER — KETAMINE HCL 10 MG/ML IJ SOLN
INTRAMUSCULAR | Status: DC | PRN
  Administered 2021-06-12: 10 mg via INTRAVENOUS
  Administered 2021-06-12: 25 mg via INTRAVENOUS

## 2021-06-12 MED ORDER — KETOROLAC TROMETHAMINE 30 MG/ML IJ SOLN
INTRAMUSCULAR | Status: AC
  Filled 2021-06-12: qty 1

## 2021-06-12 MED ORDER — FENTANYL CITRATE (PF) 100 MCG/2ML IJ SOLN: 25.0000 ug | INTRAMUSCULAR | Status: DC | PRN

## 2021-06-12 MED ORDER — LIDOCAINE HCL (CARDIAC) PF 100 MG/5ML IV SOSY
PREFILLED_SYRINGE | INTRAVENOUS | Status: DC | PRN
  Administered 2021-06-12: 80 mg via INTRAVENOUS

## 2021-06-12 MED ORDER — ACETAMINOPHEN 500 MG PO TABS: 1000.0000 mg | ORAL_TABLET | ORAL | Status: AC

## 2021-06-12 MED ORDER — MEPERIDINE HCL 25 MG/ML IJ SOLN: 6.2500 mg | INTRAMUSCULAR | Status: DC | PRN

## 2021-06-12 MED ORDER — LACTATED RINGERS IV SOLN: INTRAVENOUS | Status: DC

## 2021-06-12 MED ORDER — SUGAMMADEX SODIUM 200 MG/2ML IV SOLN
INTRAVENOUS | Status: DC | PRN
  Administered 2021-06-12: 200 mg via INTRAVENOUS

## 2021-06-12 MED ORDER — LACTATED RINGERS IR SOLN
Status: DC | PRN
  Administered 2021-06-12: 1000 mL

## 2021-06-12 MED ORDER — CEFAZOLIN SODIUM-DEXTROSE 2-4 GM/100ML-% IV SOLN
INTRAVENOUS | Status: AC
  Filled 2021-06-12: qty 100

## 2021-06-12 MED ORDER — HYDROMORPHONE HCL 1 MG/ML IJ SOLN
INTRAMUSCULAR | Status: AC
  Filled 2021-06-12: qty 1

## 2021-06-12 MED ORDER — ORAL CARE MOUTH RINSE: 15.0000 mL | Freq: Once | OROMUCOSAL | Status: AC

## 2021-06-12 MED ORDER — ROCURONIUM BROMIDE 100 MG/10ML IV SOLN
INTRAVENOUS | Status: DC | PRN
  Administered 2021-06-12: 10 mg via INTRAVENOUS
  Administered 2021-06-12 (×2): 5 mg via INTRAVENOUS
  Administered 2021-06-12: 10 mg via INTRAVENOUS
  Administered 2021-06-12: 50 mg via INTRAVENOUS

## 2021-06-12 MED ORDER — ONDANSETRON HCL 4 MG/2ML IJ SOLN
INTRAMUSCULAR | Status: AC
  Filled 2021-06-12: qty 2

## 2021-06-12 MED ORDER — OXYCODONE-ACETAMINOPHEN 5-325 MG PO TABS
1.0000 | ORAL_TABLET | ORAL | Status: DC | PRN
  Administered 2021-06-12: 1 via ORAL

## 2021-06-12 MED ORDER — PROPOFOL 10 MG/ML IV BOLUS
INTRAVENOUS | Status: DC | PRN
  Administered 2021-06-12: 20 mg via INTRAVENOUS
  Administered 2021-06-12: 50 mg via INTRAVENOUS
  Administered 2021-06-12: 150 mg via INTRAVENOUS

## 2021-06-12 MED ORDER — FENTANYL CITRATE (PF) 100 MCG/2ML IJ SOLN
INTRAMUSCULAR | Status: AC
  Filled 2021-06-12: qty 2

## 2021-06-12 MED ORDER — GABAPENTIN 300 MG PO CAPS
ORAL_CAPSULE | ORAL | Status: AC
  Administered 2021-06-12: 300 mg via ORAL
  Filled 2021-06-12: qty 1

## 2021-06-12 MED ORDER — DEXAMETHASONE SODIUM PHOSPHATE 10 MG/ML IJ SOLN
INTRAMUSCULAR | Status: DC | PRN
  Administered 2021-06-12: 5 mg via INTRAVENOUS

## 2021-06-12 MED ORDER — LIDOCAINE HCL (PF) 2 % IJ SOLN
INTRAMUSCULAR | Status: AC
  Filled 2021-06-12: qty 5

## 2021-06-12 MED ORDER — DEXMEDETOMIDINE (PRECEDEX) IN NS 20 MCG/5ML (4 MCG/ML) IV SYRINGE
PREFILLED_SYRINGE | INTRAVENOUS | Status: DC | PRN
  Administered 2021-06-12 (×2): 4 ug via INTRAVENOUS
  Administered 2021-06-12: 8 ug via INTRAVENOUS
  Administered 2021-06-12: 4 ug via INTRAVENOUS

## 2021-06-12 MED ORDER — PROPOFOL 500 MG/50ML IV EMUL
INTRAVENOUS | Status: AC
  Filled 2021-06-12: qty 50

## 2021-06-12 MED ORDER — ACETAMINOPHEN 500 MG PO TABS
ORAL_TABLET | ORAL | Status: AC
  Administered 2021-06-12: 1000 mg via ORAL
  Filled 2021-06-12: qty 2

## 2021-06-12 MED ORDER — KETAMINE HCL 50 MG/5ML IJ SOSY
PREFILLED_SYRINGE | INTRAMUSCULAR | Status: AC
  Filled 2021-06-12: qty 5

## 2021-06-12 MED ORDER — MIDAZOLAM HCL 2 MG/2ML IJ SOLN
INTRAMUSCULAR | Status: AC
  Filled 2021-06-12: qty 2

## 2021-06-12 MED ORDER — DEXAMETHASONE SODIUM PHOSPHATE 10 MG/ML IJ SOLN
INTRAMUSCULAR | Status: AC
  Filled 2021-06-12: qty 1

## 2021-06-12 MED ORDER — KETOROLAC TROMETHAMINE 30 MG/ML IJ SOLN
INTRAMUSCULAR | Status: DC | PRN
  Administered 2021-06-12: 30 mg via INTRAVENOUS

## 2021-06-12 MED ORDER — ROCURONIUM BROMIDE 10 MG/ML (PF) SYRINGE
PREFILLED_SYRINGE | INTRAVENOUS | Status: AC
  Filled 2021-06-12: qty 10

## 2021-06-12 MED ORDER — CHLORHEXIDINE GLUCONATE 0.12 % MT SOLN: 15.0000 mL | Freq: Once | OROMUCOSAL | Status: AC

## 2021-06-12 MED ORDER — CHLORHEXIDINE GLUCONATE 0.12 % MT SOLN
OROMUCOSAL | Status: AC
  Administered 2021-06-12: 15 mL via OROMUCOSAL
  Filled 2021-06-12: qty 15

## 2021-06-12 MED ORDER — PROPOFOL 500 MG/50ML IV EMUL
INTRAVENOUS | Status: DC | PRN
  Administered 2021-06-12: 20 ug/kg/min via INTRAVENOUS

## 2021-06-12 MED ORDER — HYDROMORPHONE HCL 1 MG/ML IJ SOLN
INTRAMUSCULAR | Status: DC | PRN
  Administered 2021-06-12 (×2): .5 mg via INTRAVENOUS

## 2021-06-12 MED ORDER — POVIDONE-IODINE 10 % EX SWAB
2.0000 "application " | Freq: Once | CUTANEOUS | Status: AC
  Administered 2021-06-12: 2 via TOPICAL

## 2021-06-12 MED ORDER — ONDANSETRON HCL 4 MG/2ML IJ SOLN
INTRAMUSCULAR | Status: DC | PRN
  Administered 2021-06-12: 4 mg via INTRAVENOUS

## 2021-06-12 MED ORDER — ONDANSETRON HCL 4 MG/2ML IJ SOLN: 4.0000 mg | Freq: Once | INTRAMUSCULAR | Status: DC | PRN

## 2021-06-12 MED ORDER — MIDAZOLAM HCL 2 MG/2ML IJ SOLN
INTRAMUSCULAR | Status: DC | PRN
  Administered 2021-06-12: 2 mg via INTRAVENOUS

## 2021-06-12 MED ORDER — BUPIVACAINE HCL 0.5 % IJ SOLN
INTRAMUSCULAR | Status: DC | PRN
  Administered 2021-06-12: 13 mL

## 2021-06-12 MED ORDER — FAMOTIDINE 20 MG PO TABS: 20.0000 mg | ORAL_TABLET | Freq: Once | ORAL | Status: AC

## 2021-06-12 MED ORDER — FENTANYL CITRATE (PF) 100 MCG/2ML IJ SOLN
INTRAMUSCULAR | Status: DC | PRN
  Administered 2021-06-12 (×2): 50 ug via INTRAVENOUS

## 2021-06-12 MED ORDER — FAMOTIDINE 20 MG PO TABS
ORAL_TABLET | ORAL | Status: AC
  Administered 2021-06-12: 20 mg via ORAL
  Filled 2021-06-12: qty 1

## 2021-06-12 MED ORDER — GABAPENTIN 300 MG PO CAPS: 300.0000 mg | ORAL_CAPSULE | ORAL | Status: AC

## 2021-06-12 MED ORDER — ONDANSETRON 4 MG PO TBDP: 4.0000 mg | ORAL_TABLET | Freq: Four times a day (QID) | ORAL | Status: DC | PRN

## 2021-06-12 MED ORDER — CEFAZOLIN SODIUM-DEXTROSE 2-4 GM/100ML-% IV SOLN
2.0000 g | Freq: Once | INTRAVENOUS | Status: AC
  Administered 2021-06-12: 2 g via INTRAVENOUS

## 2021-06-12 SURGICAL SUPPLY — 54 items
APL PRP STRL LF DISP 70% ISPRP (MISCELLANEOUS) ×2
APL SRG 38 LTWT LNG FL B (MISCELLANEOUS)
APL SWBSTK 6 STRL LF DISP (MISCELLANEOUS) ×2
APPLICATOR ARISTA FLEXITIP XL (MISCELLANEOUS) IMPLANT
APPLICATOR COTTON TIP 6 STRL (MISCELLANEOUS) ×2 IMPLANT
APPLICATOR COTTON TIP 6IN STRL (MISCELLANEOUS) ×3 IMPLANT
BAG DRN RND TRDRP ANRFLXCHMBR (UROLOGICAL SUPPLIES) ×2
BAG URINE DRAIN 2000ML AR STRL (UROLOGICAL SUPPLIES) ×3 IMPLANT
BLADE SURG SZ11 CARB STEEL (BLADE) ×3 IMPLANT
CATH FOLEY 2WAY  5CC 16FR (CATHETERS) ×1
CATH FOLEY 2WAY 5CC 16FR (CATHETERS) ×2
CATH URTH 16FR FL 2W BLN LF (CATHETERS) ×2 IMPLANT
CHLORAPREP W/TINT 26 (MISCELLANEOUS) ×3 IMPLANT
DRSG TEGADERM 2-3/8X2-3/4 SM (GAUZE/BANDAGES/DRESSINGS) ×7 IMPLANT
GAUZE 4X4 16PLY ~~LOC~~+RFID DBL (SPONGE) ×3 IMPLANT
GLOVE SURG SYN 8.0 (GLOVE) ×3 IMPLANT
GLOVE SURG SYN 8.0 PF PI (GLOVE) ×2 IMPLANT
GOWN STRL REUS W/ TWL LRG LVL3 (GOWN DISPOSABLE) ×4 IMPLANT
GOWN STRL REUS W/ TWL XL LVL3 (GOWN DISPOSABLE) ×2 IMPLANT
GOWN STRL REUS W/TWL LRG LVL3 (GOWN DISPOSABLE) ×6
GOWN STRL REUS W/TWL XL LVL3 (GOWN DISPOSABLE) ×3
GRASPER SUT TROCAR 14GX15 (MISCELLANEOUS) ×3 IMPLANT
HEMOSTAT ARISTA ABSORB 3G PWDR (HEMOSTASIS) IMPLANT
IRRIGATION STRYKERFLOW (MISCELLANEOUS) IMPLANT
IRRIGATOR STRYKERFLOW (MISCELLANEOUS) ×3
IV LACTATED RINGERS 1000ML (IV SOLUTION) ×1 IMPLANT
KIT PINK PAD W/HEAD ARE REST (MISCELLANEOUS) ×3
KIT PINK PAD W/HEAD ARM REST (MISCELLANEOUS) ×2 IMPLANT
KIT TURNOVER CYSTO (KITS) ×3 IMPLANT
LABEL OR SOLS (LABEL) ×3 IMPLANT
MANIFOLD NEPTUNE II (INSTRUMENTS) ×3 IMPLANT
MORCELLATOR XCISE  COR (MISCELLANEOUS) ×1
MORCELLATOR XCISE COR (MISCELLANEOUS) IMPLANT
NS IRRIG 500ML POUR BTL (IV SOLUTION) ×3 IMPLANT
PACK GYN LAPAROSCOPIC (MISCELLANEOUS) ×3 IMPLANT
PAD OB MATERNITY 4.3X12.25 (PERSONAL CARE ITEMS) ×3 IMPLANT
PAD PREP 24X41 OB/GYN DISP (PERSONAL CARE ITEMS) ×3 IMPLANT
SCRUB EXIDINE 4% CHG 4OZ (MISCELLANEOUS) ×4 IMPLANT
SET CYSTO W/LG BORE CLAMP LF (SET/KITS/TRAYS/PACK) IMPLANT
SET TUBE SMOKE EVAC HIGH FLOW (TUBING) ×3 IMPLANT
SHEARS HARMONIC ACE PLUS 36CM (ENDOMECHANICALS) ×1 IMPLANT
SLEEVE ENDOPATH XCEL 5M (ENDOMECHANICALS) ×3 IMPLANT
SOLUTION ELECTROLUBE (MISCELLANEOUS) ×3 IMPLANT
SPONGE GAUZE 2X2 8PLY STRL LF (GAUZE/BANDAGES/DRESSINGS) ×5 IMPLANT
STRIP CLOSURE SKIN 1/2X4 (GAUZE/BANDAGES/DRESSINGS) ×3 IMPLANT
SUT VIC AB 2-0 UR6 27 (SUTURE) ×5 IMPLANT
SUT VIC AB 4-0 SH 27 (SUTURE) ×6
SUT VIC AB 4-0 SH 27XANBCTRL (SUTURE) ×4 IMPLANT
SYR 10ML LL (SYRINGE) ×3 IMPLANT
SYR 20ML LL LF (SYRINGE) ×3 IMPLANT
TROCAR ENDO BLADELESS 11MM (ENDOMECHANICALS) ×3 IMPLANT
TROCAR XCEL NON-BLD 5MMX100MML (ENDOMECHANICALS) ×3 IMPLANT
TROCAR XCEL UNIV SLVE 11M 100M (ENDOMECHANICALS) ×1 IMPLANT
WATER STERILE IRR 500ML POUR (IV SOLUTION) ×3 IMPLANT

## 2021-06-12 NOTE — Anesthesia Postprocedure Evaluation (Signed)
Anesthesia Post Note  Patient: Rebecca Dudley  Procedure(s) Performed: LAPAROSCOPIC SUPRACERVICAL HYSTERECTOMY LAPAROSCOPIC BILATERAL SALPINGECTOMY (Bilateral)  Patient location during evaluation: PACU Anesthesia Type: General Level of consciousness: awake and alert, awake and oriented Pain management: pain level controlled Vital Signs Assessment: post-procedure vital signs reviewed and stable Respiratory status: spontaneous breathing, nonlabored ventilation and respiratory function stable Cardiovascular status: blood pressure returned to baseline and stable Postop Assessment: no apparent nausea or vomiting Anesthetic complications: no   No notable events documented.   Last Vitals:  Vitals:   06/12/21 1146 06/12/21 1200  BP: (!) 146/94 (!) 141/94  Pulse: 94 88  Resp: 15 17  Temp:    SpO2: 95% 95%    Last Pain:  Vitals:   06/12/21 1200  TempSrc:   PainSc: 0-No pain                 Phill Mutter

## 2021-06-12 NOTE — Progress Notes (Signed)
Pt is ready for [facility] , bilateral salpingectomy  for menorrhagia and several fibroids . Labs reviewed . All questions answered .  Proceed

## 2021-06-12 NOTE — Op Note (Signed)
NAMEALONAH, DLUGOS MEDICAL RECORD NO: [id number] ACCOUNT NO: [id number] DATE OF BIRTH: [birth year] FACILITY: [facility] LOCATION: [facility] PHYSICIAN: Boykin Nearing, MD  Operative Report   DATE OF PROCEDURE: 06/12/2021  PREOPERATIVE DIAGNOSES: 1.  Menorrhagia. 2.  Fibroid uterus.  POSTOPERATIVE DIAGNOSES:  1.  Menorrhagia. 2.  Fibroid uterus.  PROCEDURE: 1.  Laparoscopic supracervical hysterectomy. 2.  Bilateral salpingectomy.  ANESTHESIA:  General endotracheal anesthesia.  SURGEON:  Laverta Baltimore, MD  FIRST ASSISTANTLeafy Ro, MD  INDICATIONS:  A 40 year old female with a long history of menorrhagia, unresponsive to conservative therapy.  The patient has multiple uterine fibroids noted on pelvic ultrasound, with the largest fibroid measuring 9 cm.  DESCRIPTION OF PROCEDURE:  After adequate general endotracheal anesthesia, the patient was placed in dorsal supine position, legs in the [place] stirrups.  The patient's abdomen, perineum and vagina were prepped and draped in normal sterile fashion.  The  patient did receive 2 grams IV Ancef for surgical prophylaxis before commencement.  Timeout was performed.  The speculum was placed into the vagina and the anterior cervix was grasped with a single tooth tenaculum and the uterine sound was placed into  the endometrial cavity and the two were secured together with Steri-Strips.  Foley catheter was placed.  Gloves and gown were changed and attention was directed to the patient's abdomen where a 5 mm infraumbilical incision was made after injecting with  0.5% Marcaine.  A 5 mm hysteroscope was advanced into the abdominal cavity under direct visualization with the Optiview cannula.  The patient's abdomen was insufflated with carbon dioxide.  A second port was placed in left lower quadrant 3 cm medial to  the left anterior iliac spine.  An 11 mm trocar was advanced under direct visualization.  A third port was placed on  the right lower quadrant, again 3 cm medial to the right anterior iliac spine and an 11 mm trocar was advanced under direct  visualization.  Large bulbous uterus with multiple fibroids identified, uterus measuring approximately 14 cm.  The left cornua was grasped with graspers and the left fallopian tube was grasped at the distal portion of the fimbriated end.  Harmonic  scalpel was brought up to the operative field and the left fallopian tube was dissected free and removed for pathologic review in the future.  The left uteroovarian ligament was cauterized, transected and the broad ligament was then opened.  The left  uterine artery was skeletonized.  The anterior bladder flap was opened and bladder was reflected inferiorly.  Similar procedure was repeated on the patient's right side.  Visualization was maximized by using a 10 mm trocar with traction and  countertraction during the dissection.  The right fallopian tube was dissected free with Harmonic scalpel.  The round ligament on the right was opened and the uteroovarian ligament was cauterized and transected.  Broad ligament was opened and the right  uterine artery was identified, cauterized and transected with Harmonic scalpel.  Attention was then directed back to the patient's left uterine artery, which was then cauterized and transected.  The Harmonic scalpel was used to transect and amputate the  cervix at the level of the uterosacral ligaments.  The uterine sound was removed in this dissection and ultimately the uterus was amputated.  Gloves were changed again and the endocervical canal was then cauterized with the Kleppinger cautery.  Good  hemostasis was noted at the cervical stump.  Of note, there was normal peristaltic activity of the ureters prior to  dissection and post-dissection.  Gloves were changed again and the morcellator was brought up to the operative field and was advanced into  the abdominal cavity through the left lower port site  under direct visualization.  Morcellation then took place, which occurred over approximately one hour, given the size of the uterus and the dense fibroids that were encountered.  Uterine weight on  the back table, 780 grams.  The ovaries appeared normal.  Good hemostasis was noted throughout.  Pressure was lowered to 7 mmHg, which again showed good hemostasis.  Irrigation occurred and the appendix and the upper abdomen appeared normal.  No obvious  remnants of myometrium were identified at the end of the case.  Again, normal peristaltic activity of the ureters was documented.  The lower port sites were closed with the Carter-Thomason fascial closure.  The patient's abdomen was deflated and all skin  incisions were closed with interrupted 4-0 Vicryl suture.  Sterile dressings were applied.  The single tooth tenaculum was then removed from the cervix and good hemostasis was noted.  Foley catheter was removed, yielding 150 mL of urine during the case.  ESTIMATED BLOOD LOSS:  25 mL.  INTRAOPERATIVE FLUIDS:  1000 mL.  There were no complications.  The patient tolerated the procedure well and was taken to recovery room in good condition.   SHW D: 06/12/2021 11:53:58 am T: 06/12/2021 9:04:00 pm  JOB: [id number]

## 2021-06-12 NOTE — Anesthesia Preprocedure Evaluation (Addendum)
Anesthesia Evaluation  Patient identified by MRN, date of birth, ID band Patient awake    Reviewed: Allergy & Precautions, NPO status , Patient's Chart, lab work & pertinent test results  Airway Mallampati: II  TM Distance: >3 FB Neck ROM: Full    Dental no notable dental hx.    Pulmonary neg pulmonary ROS,    Pulmonary exam normal        Cardiovascular Normal cardiovascular exam+ Valvular Problems/Murmurs      Neuro/Psych negative neurological ROS  negative psych ROS   GI/Hepatic negative GI ROS, Neg liver ROS,   Endo/Other  negative endocrine ROS  Renal/GU negative Renal ROS  negative genitourinary   Musculoskeletal negative musculoskeletal ROS (+)   Abdominal   Peds negative pediatric ROS (+)  Hematology negative hematology ROS (+) anemia ,   Anesthesia Other Findings   Reproductive/Obstetrics negative OB ROS                            Anesthesia Physical Anesthesia Plan  ASA: 2  Anesthesia Plan: General   Post-op Pain Management:    Induction: Intravenous  PONV Risk Score and Plan: 3 and Propofol infusion, Midazolam and Ondansetron  Airway Management Planned: Oral ETT  Additional Equipment:   Intra-op Plan:   Post-operative Plan:   Informed Consent: I have reviewed the patients History and Physical, chart, labs and discussed the procedure including the risks, benefits and alternatives for the proposed anesthesia with the patient or authorized representative who has indicated his/her understanding and acceptance.       Plan Discussed with: CRNA, Anesthesiologist and Surgeon  Anesthesia Plan Comments:         Anesthesia Quick Evaluation

## 2021-06-12 NOTE — Discharge Instructions (Signed)

## 2021-06-12 NOTE — Transfer of Care (Signed)
Immediate Anesthesia Transfer of Care Note  Patient: Rebecca Dudley  Procedure(s) Performed: LAPAROSCOPIC SUPRACERVICAL HYSTERECTOMY LAPAROSCOPIC BILATERAL SALPINGECTOMY (Bilateral)  Patient Location: PACU  Anesthesia Type:General  Level of Consciousness: drowsy  Airway & Oxygen Therapy: Patient Spontanous Breathing and Patient connected to face mask oxygen  Post-op Assessment: Report given to RN and Post -op Vital signs reviewed and stable  Post vital signs: Reviewed and stable  Last Vitals:  Vitals Value Taken Time  BP 118/84 06/12/21 1100  Temp    Pulse 89 06/12/21 1100  Resp 16 06/12/21 1100  SpO2 96 % 06/12/21 1100  Vitals shown include unvalidated device data.  Last Pain:  Vitals:   06/12/21 0619  TempSrc: Temporal  PainSc: 0-No pain         Complications: No notable events documented.

## 2021-06-12 NOTE — Brief Op Note (Signed)
06/12/2021  10:47 AM  PATIENT:  Rebecca Dudley  40 y.o. female  PRE-OPERATIVE DIAGNOSIS:  Menorrhagia, Fibroid uterus  POST-OPERATIVE DIAGNOSIS:  Menorrhagia, Fibroid uterus  PROCEDURE:  Procedure(s): LAPAROSCOPIC SUPRACERVICAL HYSTERECTOMY (N/A) LAPAROSCOPIC BILATERAL SALPINGECTOMY (Bilateral)  SURGEON:  Surgeon(s) and Role:    * Allecia Bells, Gwen Her, MD - Primary    * Benjaman Kindler, MD - Assisting  PHYSICIAN ASSISTANT:   ASSISTANTS: none   ANESTHESIA:   general  EBL:  25 mL IOF 1000 cc uo 150 cc  BLOOD ADMINISTERED:none  DRAINS: none   LOCAL MEDICATIONS USED:  MARCAINE     SPECIMEN:  Source of Specimen:  uterus and bilateral salpingectomy   DISPOSITION OF SPECIMEN:  PATHOLOGY  COUNTS:  YES  TOURNIQUET:  * No tourniquets in log *  DICTATION: .Other Dictation: Dictation Number verbal  PLAN OF CARE: Discharge to home after PACU  PATIENT DISPOSITION:  PACU - hemodynamically stable.   Delay start of Pharmacological VTE agent (>24hrs) due to surgical blood loss or risk of bleeding: not applicable

## 2021-06-12 NOTE — Anesthesia Procedure Notes (Signed)
Procedure Name: Intubation Date/Time: 06/12/2021 7:37 AM Performed by: Bea Graff, RN Pre-anesthesia Checklist: Patient identified, Patient being monitored, Timeout performed, Emergency Drugs available and Suction available Patient Re-evaluated:Patient Re-evaluated prior to induction Oxygen Delivery Method: Circle system utilized Preoxygenation: Pre-oxygenation with 100% oxygen Induction Type: IV induction Ventilation: Mask ventilation without difficulty Laryngoscope Size: Mac and 3 Grade View: Grade I Tube type: Oral Tube size: 7.0 mm Number of attempts: 1 Airway Equipment and Method: Stylet Placement Confirmation: ETT inserted through vocal cords under direct vision, positive ETCO2 and breath sounds checked- equal and bilateral Secured at: 21 cm Tube secured with: Tape Dental Injury: Teeth and Oropharynx as per pre-operative assessment

## 2021-06-13 ENCOUNTER — Encounter: Payer: Self-pay | Admitting: Obstetrics and Gynecology

## 2021-06-13 LAB — SURGICAL PATHOLOGY

## 2021-07-25 ENCOUNTER — Encounter: Payer: Self-pay | Admitting: Obstetrics and Gynecology

## 2022-09-01 ENCOUNTER — Encounter: Payer: Self-pay | Admitting: Emergency Medicine

## 2022-09-01 ENCOUNTER — Emergency Department
Admission: EM | Admit: 2022-09-01 | Discharge: 2022-09-01 | Disposition: A | Discharge status: Home / Self Care | Payer: BC Managed Care – PPO | Attending: Emergency Medicine | Admitting: Emergency Medicine

## 2022-09-01 ENCOUNTER — Other Ambulatory Visit: Payer: Self-pay

## 2022-09-01 DIAGNOSIS — R0981 Nasal congestion: Secondary | ICD-10-CM | POA: Diagnosis present

## 2022-09-01 DIAGNOSIS — I10 Essential (primary) hypertension: Secondary | ICD-10-CM | POA: Insufficient documentation

## 2022-09-01 DIAGNOSIS — Z1152 Encounter for screening for COVID-19: Secondary | ICD-10-CM | POA: Diagnosis not present

## 2022-09-01 DIAGNOSIS — J101 Influenza due to other identified influenza virus with other respiratory manifestations: Secondary | ICD-10-CM | POA: Insufficient documentation

## 2022-09-01 LAB — RESP PANEL BY RT-PCR (FLU A&B, COVID) ARPGX2
Influenza A by PCR: POSITIVE — AB
Influenza B by PCR: NEGATIVE
SARS Coronavirus 2 by RT PCR: NEGATIVE

## 2022-09-01 MED ORDER — BENZONATATE 100 MG PO CAPS: 100.0000 mg | ORAL_CAPSULE | Freq: Three times a day (TID) | ORAL | 0 refills | Status: AC | PRN

## 2022-09-01 NOTE — ED Triage Notes (Signed)
Pt presents via POV with complaints of nasal congestion and cough for the last 5 days. Taken OTC medications with no relief. States her whole family has cold like sx since thanksgiving. Denies sore throat, chills, CP or SOB.

## 2022-09-01 NOTE — ED Provider Notes (Signed)
   [facility] Provider Note    None    (approximate)   History   Nasal Congestion   HPI  Rebecca Dudley is a 41 y.o. female otherwise healthy other then HTN who comes in with nasal congestion. She reports about  7 days of sickness. She has fever, cough, and runny nose,body aches. Taking OTC without improvement. No fever since Saturday so that was getting better. No pregnancy has had hysterectomy.     Physical Exam   Triage Vital Signs: ED Triage Vitals  Enc Vitals Group     BP 09/01/22 0532 (!) 146/98     Pulse Rate 09/01/22 0532 73     Resp 09/01/22 0532 18     Temp 09/01/22 0532 98.2 F (36.8 C)     Temp Source 09/01/22 0532 Oral     SpO2 09/01/22 0532 98 %     Weight 09/01/22 0526 194 lb 0.1 oz (88 kg)     Height 09/01/22 0526 5' (1.524 m)     Head Circumference --      Peak Flow --      Pain Score 09/01/22 0526 0     Pain Loc --      Pain Edu? --      Excl. in [place]? --     Most recent vital signs: Vitals:   09/01/22 0532  BP: (!) 146/98  Pulse: 73  Resp: 18  Temp: 98.2 F (36.8 C)  SpO2: 98%     General: Awake, no distress.  CV:  Good peripheral perfusion.  Resp:  Normal effort. Clear lungs  Abd:  No distention. Soft and non tender  Other:  OP clear uvala midline.    ED Results / Procedures / Treatments   Labs (all labs ordered are listed, but only abnormal results are displayed) Labs Reviewed  RESP PANEL BY RT-PCR (FLU A&B, COVID) ARPGX2 - Abnormal; Notable for the following components:      Result Value   Influenza A by PCR POSITIVE (*)    All other components within normal limits      MEDICATIONS ORDERED IN ED: Medications - No data to display   IMPRESSION / MDM / [place] / ED COURSE  I reviewed the triage vital signs and the nursing notes.   Patient's presentation is most consistent with acute presentation with potential threat to life or bodily function.   Pt comes in with flu symptoms  getting better.  Covid flu rsv obtained. Vitals stable- Lungs clear no signs of pneumonia. Will give work note, tessalon pearls  NO indication for tamiflu given symptom onset 7 days ago.     FINAL CLINICAL IMPRESSION(S) / ED DIAGNOSES   Final diagnoses:  Influenza A     Rx / DC Orders   ED Discharge Orders          Ordered    benzonatate (TESSALON PERLES) 100 MG capsule  3 times daily PRN        09/01/22 0739             Note:  This document was prepared using Dragon voice recognition software and may include unintentional dictation errors.   Vanessa [place], MD 09/01/22 [id number]
